# Patient Record
Sex: Male | Born: 1960 | Race: Black or African American | Hispanic: No | Marital: Single | Smoking: Never smoker
Health system: Southern US, Community
[De-identification: ages and names within clinical notes are randomized; demographics above are authoritative.]

## PROBLEM LIST (undated history)

## (undated) HISTORY — PX: KNEE ARTHROSCOPY: SUR90

---
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
## 2017-03-09 ENCOUNTER — Other Ambulatory Visit: Payer: Self-pay

## 2017-03-09 ENCOUNTER — Emergency Department (HOSPITAL_COMMUNITY): Payer: BLUE CROSS/BLUE SHIELD

## 2017-03-09 DIAGNOSIS — W19XXXA Unspecified fall, initial encounter: Secondary | ICD-10-CM | POA: Diagnosis not present

## 2017-03-09 DIAGNOSIS — S92154A Nondisplaced avulsion fracture (chip fracture) of right talus, initial encounter for closed fracture: Secondary | ICD-10-CM | POA: Diagnosis not present

## 2017-03-09 DIAGNOSIS — Y999 Unspecified external cause status: Secondary | ICD-10-CM | POA: Insufficient documentation

## 2017-03-09 DIAGNOSIS — Y939 Activity, unspecified: Secondary | ICD-10-CM | POA: Insufficient documentation

## 2017-03-09 DIAGNOSIS — Y92019 Unspecified place in single-family (private) house as the place of occurrence of the external cause: Secondary | ICD-10-CM | POA: Diagnosis not present

## 2017-03-09 DIAGNOSIS — S92511A Displaced fracture of proximal phalanx of right lesser toe(s), initial encounter for closed fracture: Secondary | ICD-10-CM | POA: Insufficient documentation

## 2017-03-09 DIAGNOSIS — S99911A Unspecified injury of right ankle, initial encounter: Secondary | ICD-10-CM | POA: Diagnosis present

## 2017-03-09 NOTE — ED Triage Notes (Signed)
EMS vitals BP144/88 P84 RR16 Pain5/10

## 2017-03-09 NOTE — ED Triage Notes (Signed)
Pt states that he had a "shooting pain and numbness down his leg" and his right ankle/foot/leg rolled underneath him and he fell. Pt still reports numbness/tingling in his leg but it has improved. No neurologic deficits.

## 2017-03-09 NOTE — ED Triage Notes (Signed)
Pt arrived EMS for c/o ankle pain after he twisted his R ankle while getting up out of bed. Swelling present laterally.

## 2017-03-10 ENCOUNTER — Emergency Department (HOSPITAL_COMMUNITY)
Admission: EM | Admit: 2017-03-10 | Discharge: 2017-03-10 | Disposition: A | Discharge status: Home / Self Care | Payer: BLUE CROSS/BLUE SHIELD | Attending: Emergency Medicine | Admitting: Emergency Medicine

## 2017-03-10 ENCOUNTER — Encounter (INDEPENDENT_AMBULATORY_CARE_PROVIDER_SITE_OTHER): Payer: Self-pay | Admitting: Orthopaedic Surgery

## 2017-03-10 ENCOUNTER — Other Ambulatory Visit: Payer: Self-pay

## 2017-03-10 ENCOUNTER — Ambulatory Visit (INDEPENDENT_AMBULATORY_CARE_PROVIDER_SITE_OTHER): Payer: BLUE CROSS/BLUE SHIELD | Admitting: Orthopaedic Surgery

## 2017-03-10 VITALS — BP 118/77 | HR 68 | Ht 73.0 in | Wt 225.0 lb

## 2017-03-10 DIAGNOSIS — S93401A Sprain of unspecified ligament of right ankle, initial encounter: Secondary | ICD-10-CM

## 2017-03-10 DIAGNOSIS — S92511A Displaced fracture of proximal phalanx of right lesser toe(s), initial encounter for closed fracture: Secondary | ICD-10-CM | POA: Diagnosis not present

## 2017-03-10 DIAGNOSIS — IMO0001 Reserved for inherently not codable concepts without codable children: Secondary | ICD-10-CM

## 2017-03-10 DIAGNOSIS — S92154A Nondisplaced avulsion fracture (chip fracture) of right talus, initial encounter for closed fracture: Secondary | ICD-10-CM

## 2017-03-10 DIAGNOSIS — S93409A Sprain of unspecified ligament of unspecified ankle, initial encounter: Secondary | ICD-10-CM

## 2017-03-10 MED ORDER — OXYCODONE-ACETAMINOPHEN 5-325 MG PO TABS: 1.0000 | ORAL_TABLET | ORAL | 0 refills | Status: AC | PRN

## 2017-03-10 MED ORDER — OXYCODONE-ACETAMINOPHEN 5-325 MG PO TABS
1.0000 | ORAL_TABLET | Freq: Once | ORAL | Status: AC
  Administered 2017-03-10: 1 via ORAL
  Filled 2017-03-10: qty 1

## 2017-03-10 NOTE — Progress Notes (Signed)
Office Visit Note   Patient: Roy Dunn           Date of Birth: 1961/01/11           MRN: [id number] Visit Date: 03/10/2017              Requested by: No referring provider defined for this encounter. PCP: Patient, No Pcp Per   Assessment & Plan: Visit Diagnoses:  1. Grade 1 ankle sprain, unspecified laterality, initial encounter   2. Closed displaced fracture of proximal phalanx of lesser toe of right foot, initial encounter      Right proximal phalanx 5th toe shaft fracture   Plan: Continue buddy taping fourth toe to fifth toe.  Cam boot applied for his ankle sprain.  Work slip given no work times 2 weeks.  Office follow-up 2 weeks.  Follow-Up Instructions: No Follow-up on file.   Orders:  No orders of the defined types were placed in this encounter.  No orders of the defined types were placed in this encounter.     Procedures: No procedures performed   Clinical Data: No additional findings.   Subjective: Chief Complaint  Patient presents with  . Right Ankle - Fracture  . Right Foot - Fracture    HPI a 57 year old male injured his ankle and great toe yesterday he got up from a chair and his foot was asleep rolling his ankle.  He suffered right 5 toe right proximal phalanx fracture and ankle sprain with tiny chip avulsion off of the talus.  He was placed in a splint has crutches is nonweightbearing and had his.  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  Patient has a prescription for Percocet is using anti-inflammatories.  History of injury to his ankle he has been active plays basketball.  Review of Systems 14 point review of systems updated.  Previous cyst surgery 93 and 2003.  Tenectomy 1.  Patient does not smoke does not drink.  On no regular medications from a physician.  Otherwise negative as it pertains to HPI   Objective: Vital Signs: BP 118/77   Pulse 68    Ht 6\' 1"  (1.854 m)   Wt 225 lb (102.1 kg)   BMI 29.69 kg/m   Physical Exam  Constitutional: He is oriented to person, place, and time. He appears well-developed and well-nourished.  HENT:  Head: Normocephalic and atraumatic.  Eyes: EOM are normal. Pupils are equal, round, and reactive to light.  Neck: No tracheal deviation present. No thyromegaly present.  Cardiovascular: Normal rate.  Pulmonary/Chest: Effort normal. He has no wheezes.  Abdominal: Soft. Bowel sounds are normal.  Neurological: He is alert and oriented to person, place, and time.  Skin: Skin is warm and dry. Capillary refill takes less than 2 seconds.  Psychiatric: He has a normal mood and affect. His behavior is normal. Judgment and thought content normal.    Ortho Exam normal knee and hip range of motion.  Right ankle shows some anterolateral swelling or tenderness over the anterior talofibular ligament region.  Fifth toe taped to the fourth toe with mild swelling.  This is a closed injury.  Sensation to the tip of the toe is intact.  Specialty Comments:  No specialty comments available.  Imaging: Dg Ankle Complete Right  Result Date: 03/09/2017 CLINICAL DATA:  Shooting pain down leg after right ankle and foot rolled beneath him after getting out  of bed. EXAM: RIGHT ANKLE - COMPLETE 3+ VIEW COMPARISON:  None. FINDINGS: Tiny ossific density off the dorsum of the anterior talus across the talonavicular joint is suspicious for small capsular avulsion off the talus. No joint dislocation is seen. There is mild soft tissue swelling about the malleoli. Base of fifth metatarsal appears intact. Small accessory ossicle is seen adjacent to the tarsal navicular. IMPRESSION: Mild soft tissue swelling about the malleoli. Intact ankle mortise. Tiny ossific density off the dorsal aspect of the anterior talus is suspicious for a tiny avulsion fracture off the talus. Electronically Signed   By: Tollie Eth M.D.   On: 03/09/2017 22:17    Dg Foot Complete Right  Result Date: 03/09/2017 CLINICAL DATA:  Pain after rolling right foot and ankle this evening. EXAM: RIGHT FOOT COMPLETE - 3+ VIEW COMPARISON:  None. FINDINGS: Acute fracture at the base of the fifth proximal phalanx with slight lateral angulation of the distal fracture fragment is noted. Tiny capsular avulsion is suspected off the dorsum of the anterior talus on the lateral view. No joint dislocations visualized. Accessory ossicle seen adjacent to the cuboid. Bipartite medial sesamoid of the first metatarsal. IMPRESSION: 1. Acute laterally angulated transverse fracture of the fifth proximal phalanx without intra-articular involvement. No joint dislocations. 2. Tiny avulsion off the dorsum of the anterior talus. Electronically Signed   By: Tollie Eth M.D.   On: 03/09/2017 22:20     PMFS History: There are no active problems to display for this patient.  History reviewed. No pertinent past medical history.  No family history on file.  Past Surgical History:  Procedure Laterality Date  . APPENDECTOMY  1981  . KNEE ARTHROSCOPY     1993 and 2003   Social History   Occupational History  . Not on file  Tobacco Use  . Smoking status: Never Smoker  . Smokeless tobacco: Never Used  Substance and Sexual Activity  . Alcohol use: No    Frequency: Never  . Drug use: No  . Sexual activity: Not on file

## 2017-03-10 NOTE — Discharge Instructions (Signed)
Take the prescribed medication as directed. Try to stay off your foot as much as you can. Follow-up with orthopedics to ensure proper healing-- call in the morning to make appt. Return to the ED for new or worsening symptoms.

## 2017-03-10 NOTE — ED Notes (Signed)
Patient left at this time with all belongings. 

## 2017-03-10 NOTE — ED Provider Notes (Signed)
[facility] Provider Note   CSN: [id number] Arrival date & time: 03/09/17  2050     History   Chief Complaint Chief Complaint  Patient presents with  . Ankle Pain  . Fall    HPI Roy Dunn is a 57 y.o. male.  The history is provided by the patient and medical records.     57 year old male with no significant past medical history presenting to the ED with right ankle and foot pain after a fall.  Patient states he had been sitting in a chair for a while and when he stood up his leg was "asleep" causing him to fall and rolled his right ankle.  States he waited for about 1/2-hour due to the pain before trying to walk on it, still unable to bear weight.  Has swelling of the right ankle and foot.  Reports he does feel some "tingling" in his foot currently but denies focal numbness.  He has no prior right foot or ankle surgeries in the past.  He did not take any medications prior to arrival.  No past medical history on file.  There are no active problems to display for this patient.       Home Medications    Prior to Admission medications   Not on File    Family History No family history on file.  Social History Social History   Tobacco Use  . Smoking status: Not on file  Substance Use Topics  . Alcohol use: Not on file  . Drug use: Not on file     Allergies   Patient has no known allergies.   Review of Systems Review of Systems  Musculoskeletal: Positive for arthralgias.  All other systems reviewed and are negative.    Physical Exam Updated Vital Signs BP 139/78 (BP Location: Right Arm)   Pulse 63   Temp 97.8 F (36.6 C) (Oral)   Resp 17   Ht 6\' 3"  (1.905 m)   Wt 102.1 kg (225 lb)   SpO2 97%   BMI 28.12 kg/m   Physical Exam  Constitutional: He is oriented to person, place, and time. He appears well-developed and well-nourished.  HENT:  Head: Normocephalic and atraumatic.  Mouth/Throat: Oropharynx  is clear and moist.  Eyes: Conjunctivae and EOM are normal. Pupils are equal, round, and reactive to light.  Neck: Normal range of motion.  Cardiovascular: Normal rate, regular rhythm and normal heart sounds.  Pulmonary/Chest: Effort normal and breath sounds normal. No stridor. No respiratory distress.  Abdominal: Soft. Bowel sounds are normal. There is no tenderness. There is no rebound.  Musculoskeletal: Normal range of motion.  Swelling over dorsum of right foot extending into the anterior ankle, there is pain with range of motion here, no skin tenting or gross deformity; able to move toes but pain throughout 5th digit; no open wounds or lacerations; DP pulse intact; compartments soft and compressible  Neurological: He is alert and oriented to person, place, and time.  Skin: Skin is warm and dry.  Psychiatric: He has a normal mood and affect.  Nursing note and vitals reviewed.    ED Treatments / Results  Labs (all labs ordered are listed, but only abnormal results are displayed) Labs Reviewed - No data to display  EKG  EKG Interpretation None       Radiology Dg Ankle Complete Right  Result Date: 03/09/2017 CLINICAL DATA:  Shooting pain down leg after right ankle and foot rolled beneath him after  getting out of bed. EXAM: RIGHT ANKLE - COMPLETE 3+ VIEW COMPARISON:  None. FINDINGS: Tiny ossific density off the dorsum of the anterior talus across the talonavicular joint is suspicious for small capsular avulsion off the talus. No joint dislocation is seen. There is mild soft tissue swelling about the malleoli. Base of fifth metatarsal appears intact. Small accessory ossicle is seen adjacent to the tarsal navicular. IMPRESSION: Mild soft tissue swelling about the malleoli. Intact ankle mortise. Tiny ossific density off the dorsal aspect of the anterior talus is suspicious for a tiny avulsion fracture off the talus. Electronically Signed   By: Tollie Eth M.D.   On: 03/09/2017 22:17   Dg  Foot Complete Right  Result Date: 03/09/2017 CLINICAL DATA:  Pain after rolling right foot and ankle this evening. EXAM: RIGHT FOOT COMPLETE - 3+ VIEW COMPARISON:  None. FINDINGS: Acute fracture at the base of the fifth proximal phalanx with slight lateral angulation of the distal fracture fragment is noted. Tiny capsular avulsion is suspected off the dorsum of the anterior talus on the lateral view. No joint dislocations visualized. Accessory ossicle seen adjacent to the cuboid. Bipartite medial sesamoid of the first metatarsal. IMPRESSION: 1. Acute laterally angulated transverse fracture of the fifth proximal phalanx without intra-articular involvement. No joint dislocations. 2. Tiny avulsion off the dorsum of the anterior talus. Electronically Signed   By: Tollie Eth M.D.   On: 03/09/2017 22:20    Procedures Procedures (including critical care time)  Medications Ordered in ED Medications  oxyCODONE-acetaminophen (PERCOCET/ROXICET) 5-325 MG per tablet 1 tablet (1 tablet Oral Given 03/10/17 0218)     Initial Impression / Assessment and Plan / ED Course  I have reviewed the triage vital signs and the nursing notes.  Pertinent labs & imaging results that were available during my care of the patient were reviewed by me and considered in my medical decision making (see chart for details).  57 year old male here after a fall.  Mechanical in nature as his leg "fell asleep" after sitting for a long time.  Has swelling of the right dorsal foot extending into the ankle as well as pain along the right fifth digit.  No gross deformities on exam.  Extremity is neurovascularly intact.  Compartments soft, compressible.  Screening x-rays with avulsion fracture of the right talus as well as fracture of the proximal right fifth toe.  Reviewed results with patient and his wife via printed x-rays.  Patient placed in short leg splint, crutches.  Will be NWB for now.  Percocet PRN, follow-up with orthopedics.   Discussed plan with patient, he acknowledged understanding and agreed with plan of care.  Return precautions given for new or worsening symptoms.  Final Clinical Impressions(s) / ED Diagnoses   Final diagnoses:  Closed nondisplaced avulsion fracture of right talus, initial encounter  Closed displaced fracture of proximal phalanx of lesser toe of right foot, initial encounter    ED Discharge Orders        Ordered    oxyCODONE-acetaminophen (PERCOCET) 5-325 MG tablet  Every 4 hours PRN     03/10/17 0253       Garlon Hatchet, PA-C 03/10/17 0412    Ward, Layla Maw, DO 03/10/17 [phone]

## 2017-03-10 NOTE — ED Notes (Signed)
Ortho tech paged  

## 2017-03-23 ENCOUNTER — Ambulatory Visit (INDEPENDENT_AMBULATORY_CARE_PROVIDER_SITE_OTHER): Payer: BLUE CROSS/BLUE SHIELD | Admitting: Surgery

## 2017-03-23 ENCOUNTER — Encounter (INDEPENDENT_AMBULATORY_CARE_PROVIDER_SITE_OTHER): Payer: Self-pay | Admitting: Surgery

## 2017-03-23 DIAGNOSIS — IMO0001 Reserved for inherently not codable concepts without codable children: Secondary | ICD-10-CM

## 2017-03-23 DIAGNOSIS — S93409A Sprain of unspecified ligament of unspecified ankle, initial encounter: Secondary | ICD-10-CM

## 2017-03-23 NOTE — Progress Notes (Signed)
   Office Visit Note   Patient: Roy Dunn           Date of Birth: 1960-03-01           MRN: [id number] Visit Date: 03/23/2017              Requested by: No referring provider defined for this encounter. PCP: Patient, No Pcp Per   Assessment & Plan: Visit Diagnoses:  1. Grade 1 ankle sprain, unspecified laterality, initial encounter     Plan: Patient will continue boot wearing boot x2 more weeks.  Ice elevate and use oral NSAID prn.    Follow-Up Instructions: Return in about 2 weeks (around 04/06/2017) for dr yates.   Orders:  No orders of the defined types were placed in this encounter.  No orders of the defined types were placed in this encounter.     Procedures: No procedures performed   Clinical Data: No additional findings.   Subjective: Chief Complaint  Patient presents with  . Right Ankle - Pain, Follow-up    HPI Patient returns for recheck of his lateral ankle sprain.  Wearing his boot.  Continues to have ongoing soreness lateral.  He also has a toe fracture and states that this is well. Review of Systems No current cardiac pulmonary GI GU issues  Objective: Vital Signs: There were no vitals taken for this visit.  Physical Exam  Constitutional: He is oriented to person, place, and time. No distress.  HENT:  Head: Normocephalic and atraumatic.  Eyes: Pupils are equal, round, and reactive to light.  Pulmonary/Chest: No respiratory distress.  Musculoskeletal:  Patient is moderate to markedly tender over the ATFL.  The ligaments feel stable.  Calf is nontender.  Neurovascular intact.  Neurological: He is alert and oriented to person, place, and time.    Ortho Exam  Specialty Comments:  No specialty comments available.  Imaging: No results found.   PMFS History: There are no active problems to display for this patient.  History reviewed. No pertinent past medical history.  History reviewed. No pertinent family history.  Past Surgical  History:  Procedure Laterality Date  . APPENDECTOMY  1981  . KNEE ARTHROSCOPY     1993 and 2003   Social History   Occupational History  . Not on file  Tobacco Use  . Smoking status: Never Smoker  . Smokeless tobacco: Never Used  Substance and Sexual Activity  . Alcohol use: No    Frequency: Never  . Drug use: No  . Sexual activity: Not on file

## 2017-04-11 ENCOUNTER — Ambulatory Visit (INDEPENDENT_AMBULATORY_CARE_PROVIDER_SITE_OTHER): Payer: BLUE CROSS/BLUE SHIELD | Admitting: Orthopaedic Surgery

## 2017-04-11 ENCOUNTER — Encounter (INDEPENDENT_AMBULATORY_CARE_PROVIDER_SITE_OTHER): Payer: Self-pay | Admitting: Orthopaedic Surgery

## 2017-04-11 VITALS — BP 127/77 | HR 62

## 2017-04-11 DIAGNOSIS — M25571 Pain in right ankle and joints of right foot: Secondary | ICD-10-CM

## 2017-04-11 NOTE — Progress Notes (Signed)
   Office Visit Note   Patient: Roy SallesLawrence Vankleeck           Date of Birth: 03-Mar-1960           MRN: [id number] Visit Date: 04/11/2017              Requested by: No referring provider defined for this encounter. PCP: Patient, No Pcp Per   Assessment & Plan: Visit Diagnoses:  1. Pain in right ankle and joints of right foot    Grade one right lateral ankle sprain.  Plan: ASO applied.  He can use his work boot with the Swede-O.  He can use the cam boot when he gets home if needed.  Follow-Up Instructions: Return if symptoms worsen or fail to improve.   Orders:  No orders of the defined types were placed in this encounter.  No orders of the defined types were placed in this encounter.     Procedures: No procedures performed   Clinical Data: No additional findings.   Subjective: Chief Complaint  Patient presents with  . Right Ankle - Follow-up, Pain    HPI follow-up right ankle sprain.  He is in a cam boot swelling is down he has some mild tenderness negative anterior drawer.  No ecchymosis is noted.  He denies fever or chills.  He is walking better and can walk with a cam boot off in the house slowly.  Review of Systems updated from last office visit unchanged 14 point review of systems updated.   Objective: Vital Signs: BP 127/77   Pulse 62   Physical Exam  Constitutional: He is oriented to person, place, and time. He appears well-developed and well-nourished.  HENT:  Head: Normocephalic and atraumatic.  Eyes: EOM are normal. Pupils are equal, round, and reactive to light.  Neck: No tracheal deviation present. No thyromegaly present.  Cardiovascular: Normal rate.  Pulmonary/Chest: Effort normal. He has no wheezes.  Abdominal: Soft. Bowel sounds are normal.  Neurological: He is alert and oriented to person, place, and time.  Skin: Skin is warm and dry. Capillary refill takes less than 2 seconds.  Psychiatric: He has a normal mood and affect. His behavior is  normal. Judgment and thought content normal.    Ortho Exam tenderness anterior talofibular calcaneal fib nontender.  Syndesmosis distal tib-fib ligament.  No ankle effusion.  Distal pulses are palpable.  No swelling in the midfoot or forefoot.  Specialty Comments:  No specialty comments available.  Imaging: No results found.   PMFS History: There are no active problems to display for this patient.  No past medical history on file.  No family history on file.  Past Surgical History:  Procedure Laterality Date  . APPENDECTOMY  1981  . KNEE ARTHROSCOPY     1993 and 2003   Social History   Occupational History  . Not on file  Tobacco Use  . Smoking status: Never Smoker  . Smokeless tobacco: Never Used  Substance and Sexual Activity  . Alcohol use: No    Frequency: Never  . Drug use: No  . Sexual activity: Not on file

## 2017-10-24 ENCOUNTER — Ambulatory Visit
Admission: RE | Admit: 2017-10-24 | Discharge: 2017-10-24 | Disposition: A | Discharge status: Home / Self Care | Payer: BLUE CROSS/BLUE SHIELD | Source: Ambulatory Visit | Attending: Family Medicine | Admitting: Family Medicine

## 2017-10-24 ENCOUNTER — Other Ambulatory Visit: Payer: Self-pay | Admitting: Family Medicine

## 2017-10-24 DIAGNOSIS — R52 Pain, unspecified: Secondary | ICD-10-CM

## 2017-11-16 ENCOUNTER — Other Ambulatory Visit: Payer: Self-pay | Admitting: Family Medicine

## 2017-11-16 DIAGNOSIS — M25571 Pain in right ankle and joints of right foot: Secondary | ICD-10-CM

## 2017-11-16 DIAGNOSIS — M25561 Pain in right knee: Secondary | ICD-10-CM

## 2017-11-20 ENCOUNTER — Ambulatory Visit
Admission: RE | Admit: 2017-11-20 | Discharge: 2017-11-20 | Disposition: A | Discharge status: Home / Self Care | Payer: BLUE CROSS/BLUE SHIELD | Source: Ambulatory Visit | Attending: Family Medicine | Admitting: Family Medicine

## 2017-11-20 DIAGNOSIS — M25571 Pain in right ankle and joints of right foot: Secondary | ICD-10-CM

## 2017-11-20 DIAGNOSIS — M25561 Pain in right knee: Secondary | ICD-10-CM

## 2017-11-22 ENCOUNTER — Other Ambulatory Visit: Payer: Self-pay | Admitting: Family Medicine

## 2017-11-22 DIAGNOSIS — M25571 Pain in right ankle and joints of right foot: Secondary | ICD-10-CM

## 2017-11-22 DIAGNOSIS — M25561 Pain in right knee: Secondary | ICD-10-CM

## 2017-12-02 ENCOUNTER — Other Ambulatory Visit: Payer: BLUE CROSS/BLUE SHIELD

## 2017-12-03 ENCOUNTER — Ambulatory Visit
Admission: RE | Admit: 2017-12-03 | Discharge: 2017-12-03 | Disposition: A | Discharge status: Home / Self Care | Payer: BLUE CROSS/BLUE SHIELD | Source: Ambulatory Visit | Attending: Family Medicine | Admitting: Family Medicine

## 2017-12-03 DIAGNOSIS — M25561 Pain in right knee: Secondary | ICD-10-CM

## 2017-12-03 DIAGNOSIS — M25571 Pain in right ankle and joints of right foot: Secondary | ICD-10-CM

## 2020-03-17 IMAGING — CR DG KNEE COMPLETE 4+V*R*
4 series · 4 of 4 positions shown · non-contrast
Comparison: None.

CLINICAL DATA: Persistent right knee pain for the past 4 months
following fall.

EXAM:
RIGHT KNEE - COMPLETE 4+ VIEW

[w knee ap right]
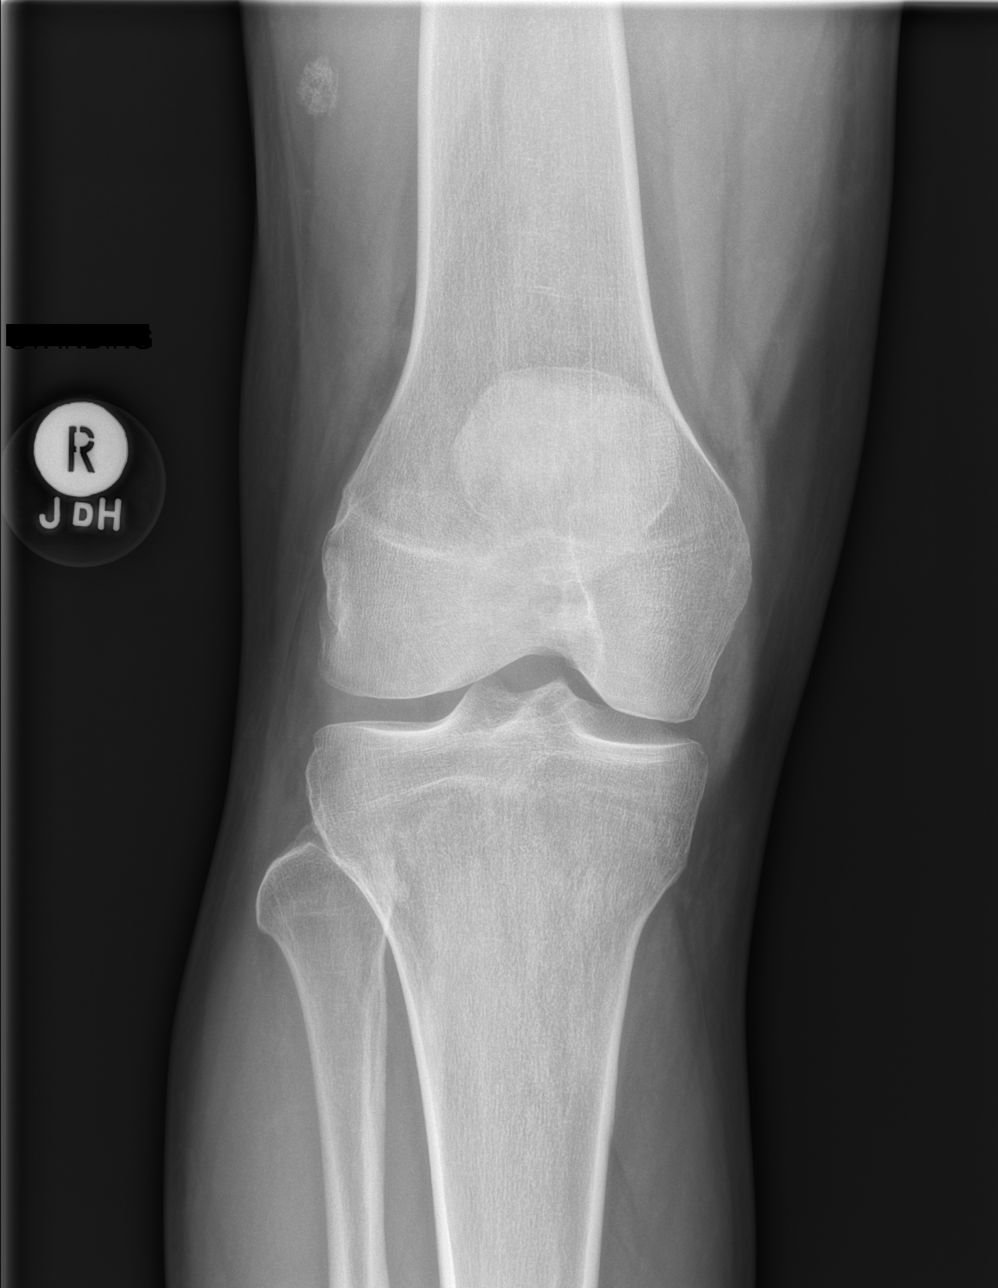

[w knee obl right (1 of 2)]
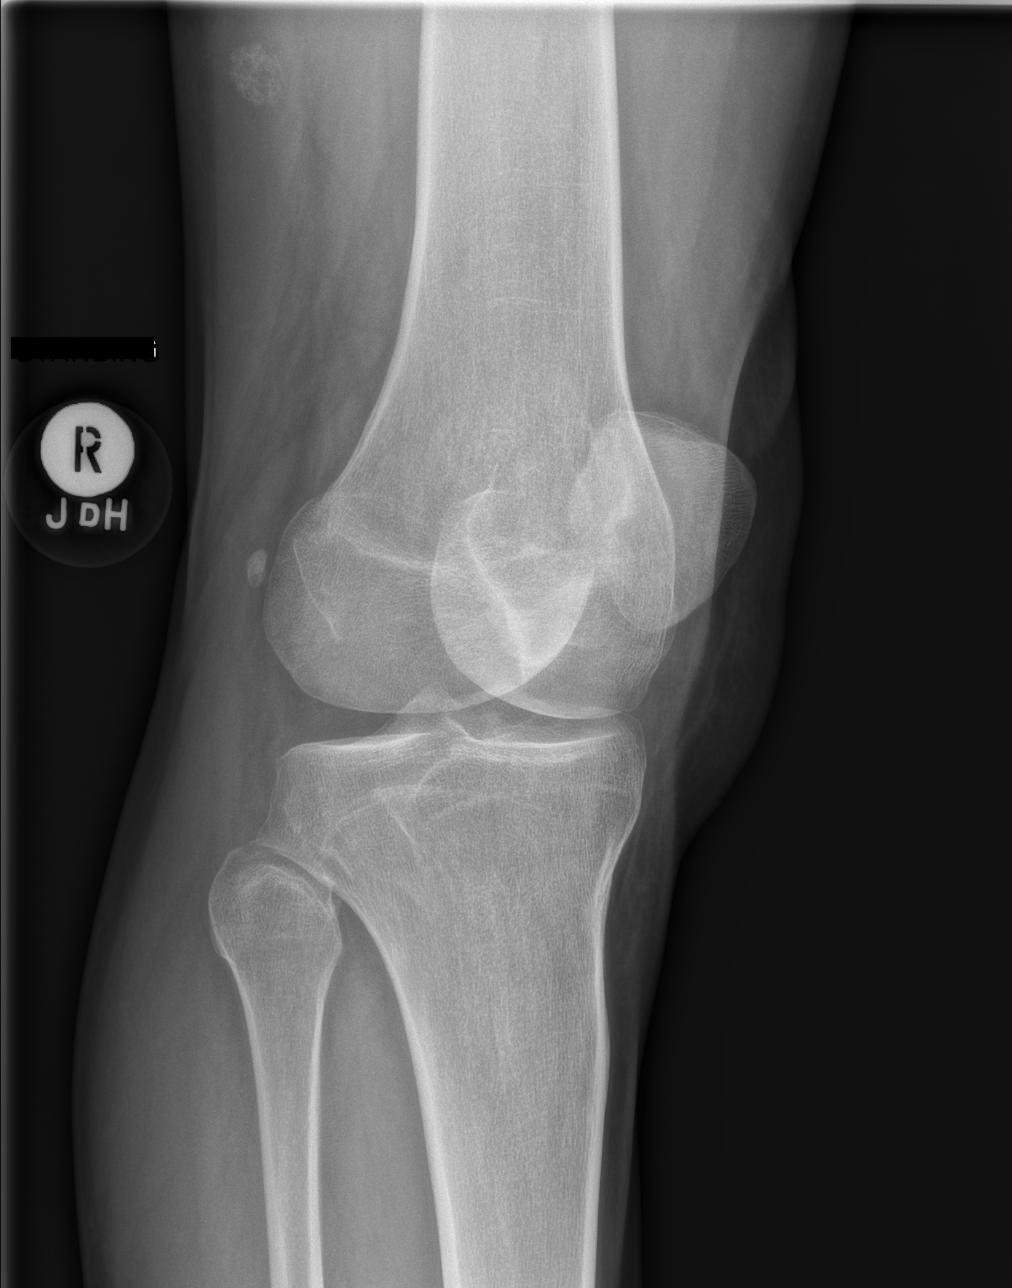

[w knee obl right (2 of 2)]
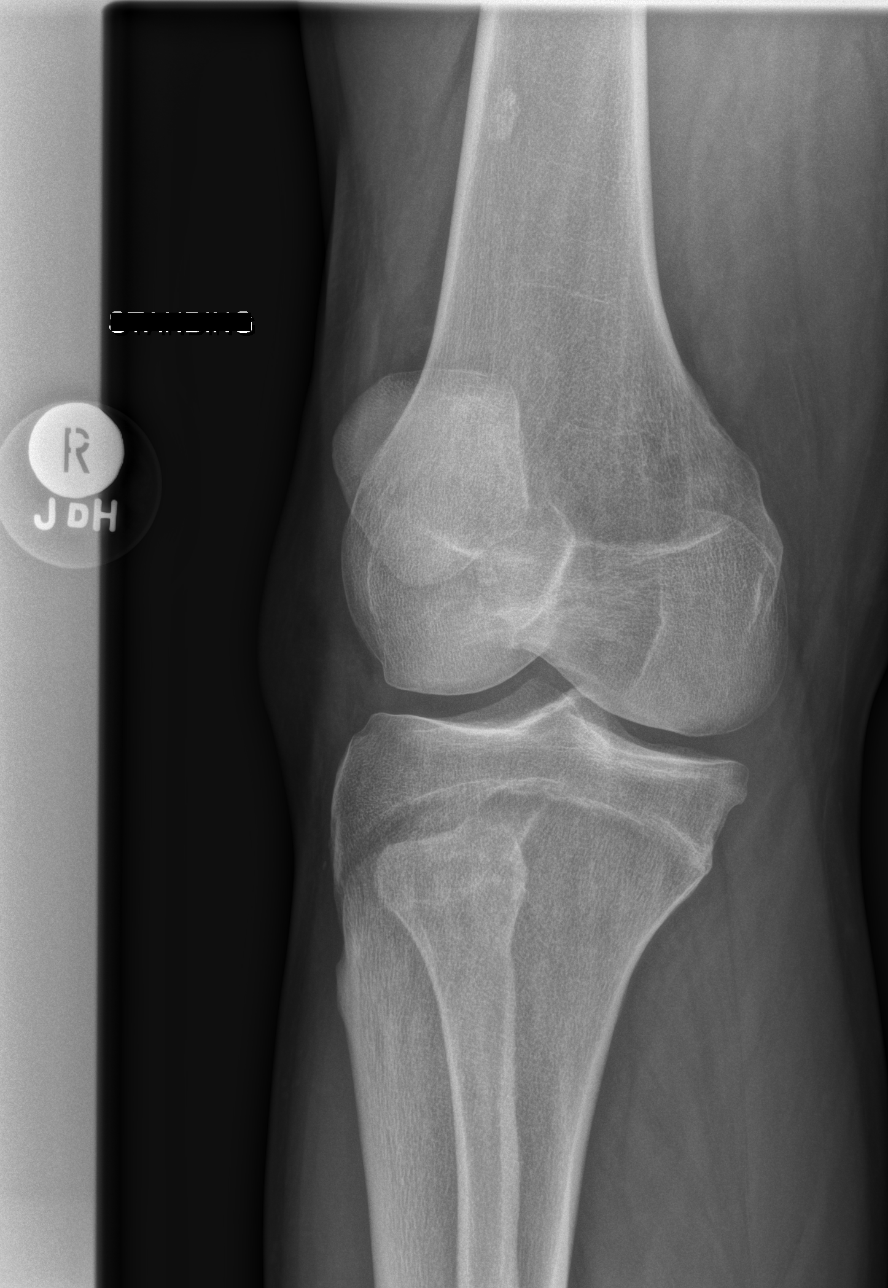

[w knee lat right]
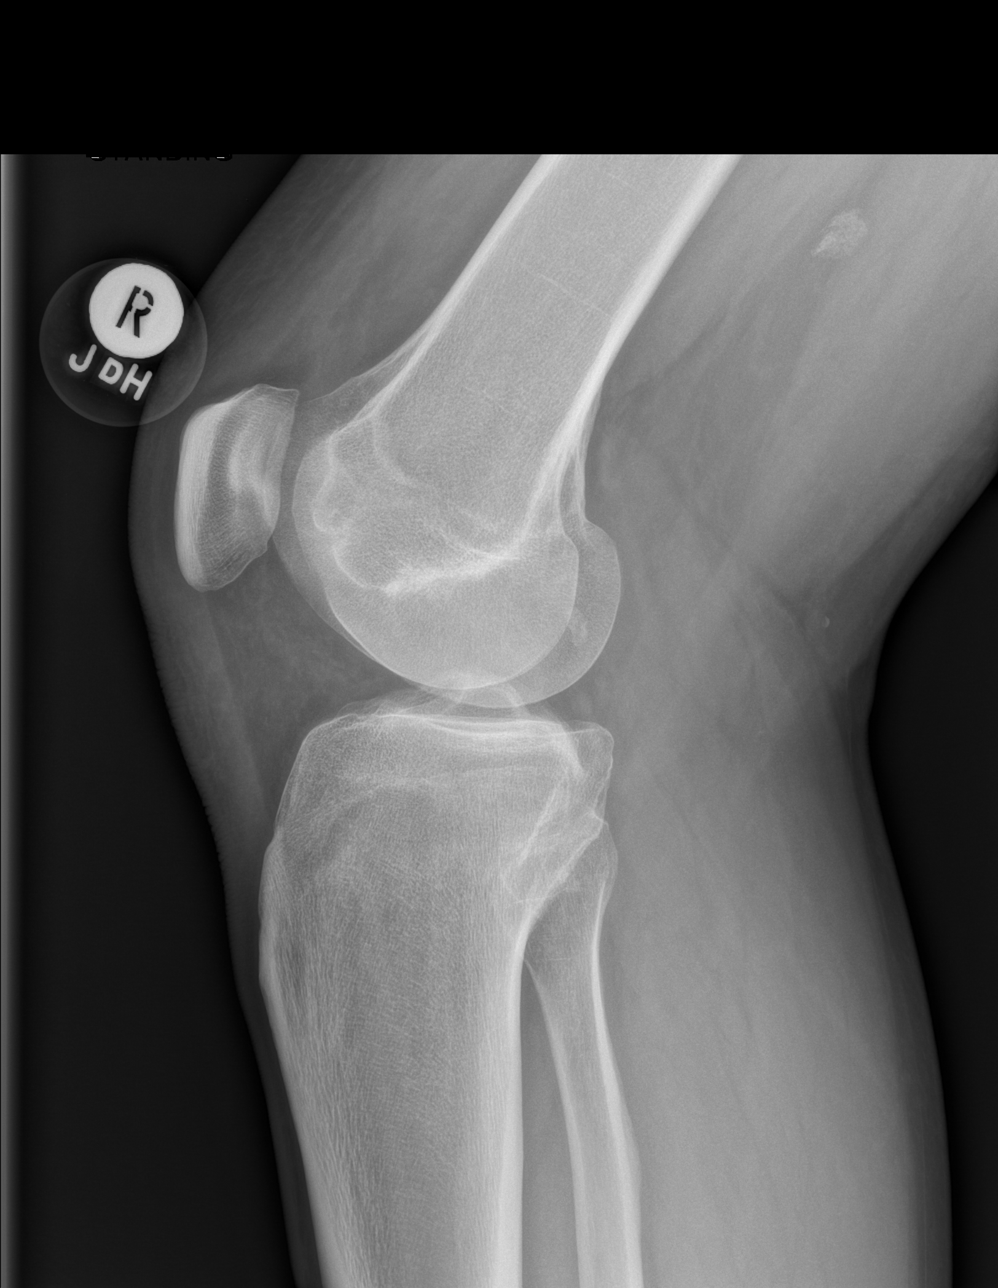

[4 of 4 positions shown; findings below may reference images not displayed]

FINDINGS: No fracture or dislocation. Mild degenerative change involving the
medial compartment of the knee with joint space loss and subchondral
sclerosis. No evidence of chondrocalcinosis. No joint effusion.
Dermal versus intramuscular calcification about the posterolateral
aspect of the thigh. No subcutaneous emphysema.
IMPRESSION: 1. No acute findings.
2. Mild degenerative change of the medial compartment of the knee.
